# Patient Record
Sex: Male | Born: 1975
Health system: Southern US, Community
[De-identification: ages and names within clinical notes are randomized; demographics above are authoritative.]

---

## 2001-12-03 ENCOUNTER — Emergency Department (HOSPITAL_COMMUNITY): Admission: EM | Admit: 2001-12-03 | Discharge: 2001-12-03 | Payer: Self-pay | Admitting: Emergency Medicine

## 2001-12-11 ENCOUNTER — Emergency Department (HOSPITAL_COMMUNITY): Admission: EM | Admit: 2001-12-11 | Discharge: 2001-12-11 | Payer: Self-pay | Admitting: *Deleted

## 2002-04-21 ENCOUNTER — Emergency Department (HOSPITAL_COMMUNITY): Admission: EM | Admit: 2002-04-21 | Discharge: 2002-04-21 | Payer: Self-pay | Admitting: Emergency Medicine

## 2010-04-05 ENCOUNTER — Emergency Department (HOSPITAL_BASED_OUTPATIENT_CLINIC_OR_DEPARTMENT_OTHER): Admission: EM | Admit: 2010-04-05 | Discharge: 2010-04-05 | Payer: Self-pay | Admitting: Emergency Medicine

## 2010-08-25 ENCOUNTER — Emergency Department (HOSPITAL_COMMUNITY): Admission: EM | Admit: 2010-08-25 | Discharge: 2010-08-25 | Payer: Self-pay | Admitting: Emergency Medicine

## 2011-02-14 LAB — URINALYSIS, ROUTINE W REFLEX MICROSCOPIC
Bilirubin Urine: NEGATIVE
Ketones, ur: NEGATIVE mg/dL
Nitrite: NEGATIVE
Protein, ur: NEGATIVE mg/dL
Specific Gravity, Urine: 1.022 (ref 1.005–1.030)
Urobilinogen, UA: 0.2 mg/dL (ref 0.0–1.0)

## 2011-02-14 LAB — GC/CHLAMYDIA PROBE AMP, GENITAL: Chlamydia, DNA Probe: POSITIVE — AB

## 2011-02-14 LAB — URINE MICROSCOPIC-ADD ON

## 2011-06-18 ENCOUNTER — Emergency Department (HOSPITAL_BASED_OUTPATIENT_CLINIC_OR_DEPARTMENT_OTHER)
Admission: EM | Admit: 2011-06-18 | Discharge: 2011-06-18 | Disposition: A | Discharge status: Home / Self Care | Payer: Self-pay | Attending: Emergency Medicine | Admitting: Emergency Medicine

## 2011-06-18 ENCOUNTER — Encounter: Payer: Self-pay | Admitting: Emergency Medicine

## 2011-06-18 DIAGNOSIS — K047 Periapical abscess without sinus: Secondary | ICD-10-CM | POA: Insufficient documentation

## 2011-06-18 MED ORDER — PENICILLIN V POTASSIUM 500 MG PO TABS: 500.0000 mg | ORAL_TABLET | Freq: Three times a day (TID) | ORAL | Status: AC

## 2011-06-18 MED ORDER — NAPROXEN 500 MG PO TABS: 500.0000 mg | ORAL_TABLET | Freq: Two times a day (BID) | ORAL | Status: AC

## 2011-06-18 MED ORDER — HYDROCODONE-ACETAMINOPHEN 5-325 MG PO TABS: 1.0000 | ORAL_TABLET | ORAL | Status: AC | PRN

## 2011-06-18 NOTE — ED Notes (Signed)
No PCP 

## 2011-06-18 NOTE — ED Notes (Signed)
Pt c/o dental pain (upper LT) x 2 days

## 2011-06-18 NOTE — ED Provider Notes (Signed)
History     Chief Complaint  Patient presents with  . Dental Pain   HPI Comments: Patient is aware that he had a broken left upper molar for a number of weeks to months. In the last 2 days he started having some pain in temperature sensitivity. He does not have a dentist of his own.  Patient is a 35 y.o. male presenting with tooth pain. The history is provided by the patient.  Dental PainThe primary symptoms include mouth pain. Primary symptoms do not include dental injury, oral bleeding, oral lesions, headaches, fever, shortness of breath, sore throat, angioedema or cough. The symptoms began 2 days ago. The symptoms are unchanged. The symptoms are new. The symptoms occur constantly.  Additional symptoms include: dental sensitivity to temperature and gum tenderness. Additional symptoms do not include: gum swelling, purulent gums, trismus, jaw pain, facial swelling, trouble swallowing, pain with swallowing, excessive salivation, dry mouth, taste disturbance, smell disturbance, drooling, ear pain, hearing loss, nosebleeds, swollen glands, goiter and fatigue.    History reviewed. No pertinent past medical history.  History reviewed. No pertinent past surgical history.  No family history on file.  History  Substance Use Topics  . Smoking status: Never Smoker   . Smokeless tobacco: Not on file  . Alcohol Use: Yes     occ      Review of Systems  Constitutional: Negative.  Negative for fever, chills and fatigue.  HENT: Positive for dental problem. Negative for hearing loss, ear pain, nosebleeds, sore throat, facial swelling, drooling and trouble swallowing.   Eyes: Negative.  Negative for discharge and redness.  Respiratory: Negative.  Negative for cough and shortness of breath.   Cardiovascular: Negative.  Negative for chest pain.  Gastrointestinal: Negative.  Negative for nausea, vomiting and abdominal pain.  Genitourinary: Negative.  Negative for hematuria.  Musculoskeletal:  Negative.  Negative for back pain.  Skin: Negative.  Negative for color change and rash.  Neurological: Negative for syncope and headaches.  Hematological: Negative.  Negative for adenopathy.  Psychiatric/Behavioral: Negative.  Negative for confusion.  All other systems reviewed and are negative.    Physical Exam  BP 120/75  Pulse 65  Temp(Src) 98.7 F (37.1 C) (Oral)  Resp 16  Ht 5\' 11"  (1.803 m)  Wt 215 lb (97.523 kg)  BMI 29.99 kg/m2  SpO2 100%  Physical Exam  Constitutional: He is oriented to person, place, and time. He appears well-developed and well-nourished.  HENT:  Head: Normocephalic and atraumatic.       Tooth #15 is broken. There is surrounding tenderness to palpation of the gums. There is no significant swelling noted. Face appears symmetric with no swelling and no lymphadenopathy in his anterior cervical nodes submandibular nodes. Tissues underneath the tongue are soft.  Eyes: Conjunctivae and EOM are normal. Pupils are equal, round, and reactive to light.  Neck: Normal range of motion. Neck supple.  Pulmonary/Chest: Effort normal.  Musculoskeletal: Normal range of motion.  Neurological: He is alert and oriented to person, place, and time.  Skin: Skin is warm and dry.  Psychiatric: He has a normal mood and affect. His behavior is normal. Judgment and thought content normal.    ED Course  Procedures  MDM Patient has likely early dental abscess. I will place him on penicillin and given pain medicine for home. He will be given a dental clinic list and he can followup with a dentist for tooth removal.      Nat Christen, MD 06/18/11 [id number]

## 2013-07-27 ENCOUNTER — Encounter (HOSPITAL_BASED_OUTPATIENT_CLINIC_OR_DEPARTMENT_OTHER): Payer: Self-pay

## 2013-07-27 ENCOUNTER — Emergency Department (HOSPITAL_BASED_OUTPATIENT_CLINIC_OR_DEPARTMENT_OTHER)
Admission: EM | Admit: 2013-07-27 | Discharge: 2013-07-27 | Disposition: A | Discharge status: Home / Self Care | Payer: Self-pay | Attending: Emergency Medicine | Admitting: Emergency Medicine

## 2013-07-27 DIAGNOSIS — L089 Local infection of the skin and subcutaneous tissue, unspecified: Secondary | ICD-10-CM | POA: Insufficient documentation

## 2013-07-27 DIAGNOSIS — R21 Rash and other nonspecific skin eruption: Secondary | ICD-10-CM | POA: Insufficient documentation

## 2013-07-27 DIAGNOSIS — Y929 Unspecified place or not applicable: Secondary | ICD-10-CM | POA: Insufficient documentation

## 2013-07-27 DIAGNOSIS — L539 Erythematous condition, unspecified: Secondary | ICD-10-CM | POA: Insufficient documentation

## 2013-07-27 DIAGNOSIS — Y939 Activity, unspecified: Secondary | ICD-10-CM | POA: Insufficient documentation

## 2013-07-27 DIAGNOSIS — L03119 Cellulitis of unspecified part of limb: Secondary | ICD-10-CM

## 2013-07-27 MED ORDER — HYDROCODONE-ACETAMINOPHEN 5-325 MG PO TABS
2.0000 | ORAL_TABLET | Freq: Once | ORAL | Status: AC
  Administered 2013-07-27: 2 via ORAL
  Filled 2013-07-27: qty 2

## 2013-07-27 MED ORDER — HYDROCODONE-ACETAMINOPHEN 5-325 MG PO TABS: 2.0000 | ORAL_TABLET | ORAL | Status: DC | PRN

## 2013-07-27 MED ORDER — SULFAMETHOXAZOLE-TRIMETHOPRIM 800-160 MG PO TABS: 1.0000 | ORAL_TABLET | Freq: Two times a day (BID) | ORAL | Status: AC

## 2013-07-27 NOTE — ED Notes (Signed)
Patient reports insect bite 2 days ago on lower left leg, raised area with pus noted, denies drainage. Soreness to site

## 2013-07-27 NOTE — ED Provider Notes (Signed)
CSN: [id number]     Arrival date & time 07/27/13  1946 History   First MD Initiated Contact with Patient 07/27/13 2112     Chief Complaint  Patient presents with  . Insect Bite   (Consider location/radiation/quality/duration/timing/severity/associated sxs/prior Treatment) Patient is a 37 y.o. male presenting with rash. The history is provided by the patient. No language interpreter was used.  Rash Location:  Leg Leg rash location:  L leg Quality: blistering and redness   Severity:  Moderate Onset quality:  Gradual Duration:  2 days Timing:  Constant Progression:  Worsening Chronicity:  New Relieved by:  Nothing Ineffective treatments:  None tried Pt reports he was bitten by something 2 days ago.  Pt reports areas are red and swollen now.  Pustule   History reviewed. No pertinent past medical history. History reviewed. No pertinent past surgical history. No family history on file. History  Substance Use Topics  . Smoking status: Never Smoker   . Smokeless tobacco: Not on file  . Alcohol Use: Yes     Comment: occ    Review of Systems  Skin: Positive for rash and wound.  All other systems reviewed and are negative.    Allergies  Review of patient's allergies indicates no known allergies.  Home Medications  No current outpatient prescriptions on file. BP 123/77  Pulse 92  Temp(Src) 99.3 F (37.4 C) (Oral)  Resp 18  SpO2 99% Physical Exam  Nursing note and vitals reviewed. Constitutional: He is oriented to person, place, and time. He appears well-developed and well-nourished.  HENT:  Head: Normocephalic.  Musculoskeletal: He exhibits tenderness.  Pustule left ankle, erythema and swelling  Neurological: He is alert and oriented to person, place, and time. He has normal reflexes.  Skin: There is erythema.  Psychiatric: He has a normal mood and affect.    ED Course  Procedures (including critical care time) Labs Review Labs Reviewed  WOUND CULTURE    Imaging Review No results found.  MDM   1. Bacterial skin infection of leg    I open pustule with 18 gauge to obtain a culture   Elson Areas, PA-C 07/27/13 2130

## 2013-07-27 NOTE — ED Provider Notes (Signed)
Medical screening examination/treatment/procedure(s) were performed by non-physician practitioner and as supervising physician I was immediately available for consultation/collaboration.  Kristen N Ward, DO 07/27/13 2315 

## 2013-07-30 ENCOUNTER — Telehealth (HOSPITAL_COMMUNITY): Payer: Self-pay | Admitting: Emergency Medicine

## 2013-07-30 LAB — WOUND CULTURE: Gram Stain: NONE SEEN

## 2015-05-26 ENCOUNTER — Emergency Department (HOSPITAL_COMMUNITY)
Admission: EM | Admit: 2015-05-26 | Discharge: 2015-05-27 | Disposition: A | Discharge status: Home / Self Care | Payer: Self-pay | Attending: Emergency Medicine | Admitting: Emergency Medicine

## 2015-05-26 ENCOUNTER — Encounter (HOSPITAL_COMMUNITY): Payer: Self-pay

## 2015-05-26 ENCOUNTER — Emergency Department (HOSPITAL_COMMUNITY): Payer: Self-pay

## 2015-05-26 DIAGNOSIS — Y998 Other external cause status: Secondary | ICD-10-CM | POA: Insufficient documentation

## 2015-05-26 DIAGNOSIS — S20212A Contusion of left front wall of thorax, initial encounter: Secondary | ICD-10-CM | POA: Insufficient documentation

## 2015-05-26 DIAGNOSIS — Z791 Long term (current) use of non-steroidal anti-inflammatories (NSAID): Secondary | ICD-10-CM | POA: Insufficient documentation

## 2015-05-26 DIAGNOSIS — S299XXA Unspecified injury of thorax, initial encounter: Secondary | ICD-10-CM

## 2015-05-26 DIAGNOSIS — Y9389 Activity, other specified: Secondary | ICD-10-CM | POA: Insufficient documentation

## 2015-05-26 DIAGNOSIS — Y9289 Other specified places as the place of occurrence of the external cause: Secondary | ICD-10-CM | POA: Insufficient documentation

## 2015-05-26 DIAGNOSIS — W01198A Fall on same level from slipping, tripping and stumbling with subsequent striking against other object, initial encounter: Secondary | ICD-10-CM | POA: Insufficient documentation

## 2015-05-26 MED ORDER — HYDROCODONE-ACETAMINOPHEN 5-325 MG PO TABS: 1.0000 | ORAL_TABLET | ORAL | Status: DC | PRN

## 2015-05-26 MED ORDER — IBUPROFEN 800 MG PO TABS
800.0000 mg | ORAL_TABLET | Freq: Once | ORAL | Status: AC
  Administered 2015-05-27: 800 mg via ORAL
  Filled 2015-05-26: qty 1

## 2015-05-26 MED ORDER — NAPROXEN 500 MG PO TABS: 500.0000 mg | ORAL_TABLET | Freq: Two times a day (BID) | ORAL | Status: DC

## 2015-05-26 NOTE — ED Provider Notes (Signed)
CSN: [id number]     Arrival date & time 05/26/15  2311 History   First MD Initiated Contact with Patient 05/26/15 2317     Chief Complaint  Patient presents with  . Chest Injury     (Consider location/radiation/quality/duration/timing/severity/associated sxs/prior Treatment) The history is provided by the patient.   Kyle Park is a 39 y.o. male who presents to the ED with chest wall pain. He states that he fell over a stick and the stick hit him in the chest. The pain increases with movement. He denies shortness of breath, palpations, n/v or other problems.   History reviewed. No pertinent past medical history. History reviewed. No pertinent past surgical history. No family history on file. History  Substance Use Topics  . Smoking status: Never Smoker   . Smokeless tobacco: Not on file  . Alcohol Use: Yes     Comment: occ    Review of Systems  Cardiovascular:       Left chest wall pain  all other systems negative    Allergies  Review of patient's allergies indicates no known allergies.  Home Medications   Prior to Admission medications   Medication Sig Start Date End Date Taking? Authorizing Provider  HYDROcodone-acetaminophen (NORCO/VICODIN) 5-325 MG per tablet Take 1 tablet by mouth every 4 (four) hours as needed for severe pain. 05/26/15   Jlynn Langille Orlene Och, NP  naproxen (NAPROSYN) 500 MG tablet Take 1 tablet (500 mg total) by mouth 2 (two) times daily. 05/26/15   Ithan Touhey Orlene Och, NP   BP 115/78 mmHg  Pulse 68  Temp(Src) 98.8 F (37.1 C) (Oral)  Resp 16  Ht  (1.803 m)  Wt 190 lb (86.183 kg)  BMI 26.51 kg/m2  SpO2 100% Physical Exam  Constitutional: He is oriented to person, place, and time. He appears well-developed and well-nourished. No distress.  Eyes: Conjunctivae and EOM are normal.  Neck: Normal range of motion. Neck supple.  Cardiovascular: Normal rate and regular rhythm.   Pulmonary/Chest: Effort normal and breath sounds normal. He has no wheezes. He  has no rales. He exhibits tenderness. He exhibits no crepitus.    Abdominal: Soft. Bowel sounds are normal. There is no tenderness.  Musculoskeletal: Normal range of motion.  Neurological: He is alert and oriented to person, place, and time. No cranial nerve deficit.  Skin: Skin is warm and dry.  Psychiatric: He has a normal mood and affect. His behavior is normal.  Nursing note and vitals reviewed.   ED Course  Procedures (including critical care time) Labs Review Labs Reviewed - No data to display  Imaging Review Dg Chest 2 View  05/26/2015   CLINICAL DATA:  Fall 2 days ago with left-sided chest pain, initial encounter  EXAM: CHEST - 2 VIEW  COMPARISON:  None.  FINDINGS: The heart size and mediastinal contours are within normal limits. Both lungs are clear. The visualized skeletal structures are unremarkable.  IMPRESSION: No active disease.   Electronically Signed   By: Alcide Clever M.D.   On: 05/26/2015 23:51     EKG Interpretation None      MDM  39 y.o. male with left chest wall pain after falling on a stick 2 days ago. Will treat for pain and inflammation. He will return if symptoms worsen. Discussed with the patient and all questioned fully answered.   Final diagnoses:  Chest wall injury, initial encounter  Chest wall contusion, left, initial encounter       [facility] Orlene Och, NP  05/27/15 0004  Shon Batonourtney F Horton, MD 05/27/15 [phone]

## 2015-05-26 NOTE — ED Notes (Signed)
Pt states he fell 2 days ago, tripped and fell over a stick that hit him in the chest, states pain in that area is worse with movement.

## 2015-05-26 NOTE — Discharge Instructions (Signed)

## 2015-06-10 MED FILL — Hydrocodone-Acetaminophen Tab 5-325 MG: ORAL | Qty: 6 | Status: AC

## 2016-08-17 ENCOUNTER — Emergency Department (HOSPITAL_COMMUNITY)
Admission: EM | Admit: 2016-08-17 | Discharge: 2016-08-17 | Disposition: A | Discharge status: Home / Self Care | Payer: Self-pay | Attending: Dermatology | Admitting: Dermatology

## 2016-08-17 ENCOUNTER — Encounter (HOSPITAL_COMMUNITY): Payer: Self-pay | Admitting: Cardiology

## 2016-08-17 DIAGNOSIS — Z5321 Procedure and treatment not carried out due to patient leaving prior to being seen by health care provider: Secondary | ICD-10-CM | POA: Insufficient documentation

## 2016-08-17 DIAGNOSIS — R1110 Vomiting, unspecified: Secondary | ICD-10-CM | POA: Insufficient documentation

## 2016-08-17 NOTE — ED Triage Notes (Signed)
Vomiting 2-3 days.  [facility].

## 2016-08-17 NOTE — ED Notes (Signed)
Pt not in treatment area or waiting room.  Unable to determine the time patient left the department.  Chart removed from track board at this time

## 2016-09-02 ENCOUNTER — Encounter (HOSPITAL_COMMUNITY): Payer: Self-pay | Admitting: Emergency Medicine

## 2016-09-02 ENCOUNTER — Emergency Department (HOSPITAL_COMMUNITY): Payer: Self-pay

## 2016-09-02 ENCOUNTER — Emergency Department (HOSPITAL_COMMUNITY)
Admission: EM | Admit: 2016-09-02 | Discharge: 2016-09-02 | Disposition: A | Discharge status: Home / Self Care | Payer: Self-pay | Attending: Emergency Medicine | Admitting: Emergency Medicine

## 2016-09-02 DIAGNOSIS — Z79899 Other long term (current) drug therapy: Secondary | ICD-10-CM | POA: Insufficient documentation

## 2016-09-02 DIAGNOSIS — K529 Noninfective gastroenteritis and colitis, unspecified: Secondary | ICD-10-CM | POA: Insufficient documentation

## 2016-09-02 DIAGNOSIS — R112 Nausea with vomiting, unspecified: Secondary | ICD-10-CM

## 2016-09-02 DIAGNOSIS — R197 Diarrhea, unspecified: Secondary | ICD-10-CM

## 2016-09-02 LAB — URINALYSIS, ROUTINE W REFLEX MICROSCOPIC
Bilirubin Urine: NEGATIVE
Glucose, UA: NEGATIVE mg/dL
LEUKOCYTES UA: NEGATIVE
NITRITE: NEGATIVE
PH: 5.5 (ref 5.0–8.0)
PROTEIN: NEGATIVE mg/dL
Specific Gravity, Urine: >1.03 — ABNORMAL HIGH (ref 1.005–1.030)

## 2016-09-02 LAB — COMPREHENSIVE METABOLIC PANEL
ALT: 13 U/L — ABNORMAL LOW (ref 17–63)
AST: 22 U/L (ref 15–41)
Albumin: 4.5 g/dL (ref 3.5–5.0)
Alkaline Phosphatase: 61 U/L (ref 38–126)
Anion gap: 10 (ref 5–15)
BILIRUBIN TOTAL: 0.7 mg/dL (ref 0.3–1.2)
BUN: 14 mg/dL (ref 6–20)
CALCIUM: 9.1 mg/dL (ref 8.9–10.3)
CO2: 26 mmol/L (ref 22–32)
Chloride: 101 mmol/L (ref 101–111)
Creatinine, Ser: 1.26 mg/dL — ABNORMAL HIGH (ref 0.61–1.24)
GFR calc Af Amer: >60 mL/min (ref >60)
Glucose, Bld: 133 mg/dL — ABNORMAL HIGH (ref 65–99)
POTASSIUM: 4.3 mmol/L (ref 3.5–5.1)
Sodium: 137 mmol/L (ref 135–145)
TOTAL PROTEIN: 7.7 g/dL (ref 6.5–8.1)

## 2016-09-02 LAB — CBC
HEMATOCRIT: 40.5 % (ref 39.0–52.0)
Hemoglobin: 13.6 g/dL (ref 13.0–17.0)
MCH: 27.1 pg (ref 26.0–34.0)
MCHC: 33.6 g/dL (ref 30.0–36.0)
MCV: 80.8 fL (ref 78.0–100.0)
PLATELETS: 275 10*3/uL (ref 150–400)
RBC: 5.01 MIL/uL (ref 4.22–5.81)
RDW: 14.3 % (ref 11.5–15.5)
WBC: 9.6 10*3/uL (ref 4.0–10.5)

## 2016-09-02 LAB — URINE MICROSCOPIC-ADD ON

## 2016-09-02 LAB — LIPASE, BLOOD: Lipase: 17 U/L (ref 11–51)

## 2016-09-02 MED ORDER — ONDANSETRON HCL 4 MG PO TABS: 4.0000 mg | ORAL_TABLET | Freq: Four times a day (QID) | ORAL | 0 refills | Status: DC

## 2016-09-02 MED ORDER — IOPAMIDOL (ISOVUE-300) INJECTION 61%
100.0000 mL | Freq: Once | INTRAVENOUS | Status: AC | PRN
  Administered 2016-09-02: 100 mL via INTRAVENOUS

## 2016-09-02 MED ORDER — PROMETHAZINE HCL 25 MG/ML IJ SOLN
12.5000 mg | Freq: Once | INTRAMUSCULAR | Status: AC
  Administered 2016-09-02: 12.5 mg via INTRAVENOUS
  Filled 2016-09-02: qty 1

## 2016-09-02 MED ORDER — FAMOTIDINE 20 MG PO TABS: 20.0000 mg | ORAL_TABLET | Freq: Two times a day (BID) | ORAL | 0 refills | Status: DC

## 2016-09-02 MED ORDER — FAMOTIDINE IN NACL 20-0.9 MG/50ML-% IV SOLN
20.0000 mg | Freq: Once | INTRAVENOUS | Status: AC
  Administered 2016-09-02: 20 mg via INTRAVENOUS
  Filled 2016-09-02: qty 50

## 2016-09-02 MED ORDER — ONDANSETRON HCL 4 MG/2ML IJ SOLN
INTRAMUSCULAR | Status: AC
  Administered 2016-09-02: 4 mg via INTRAVENOUS
  Filled 2016-09-02: qty 2

## 2016-09-02 MED ORDER — ONDANSETRON HCL 4 MG/2ML IJ SOLN
4.0000 mg | Freq: Once | INTRAMUSCULAR | Status: AC | PRN
  Administered 2016-09-02: 4 mg via INTRAVENOUS

## 2016-09-02 MED ORDER — SODIUM CHLORIDE 0.9 % IV BOLUS (SEPSIS)
1000.0000 mL | Freq: Once | INTRAVENOUS | Status: AC
  Administered 2016-09-02: 1000 mL via INTRAVENOUS

## 2016-09-02 MED ORDER — IOPAMIDOL (ISOVUE-300) INJECTION 61%
INTRAVENOUS | Status: AC
  Filled 2016-09-02: qty 30

## 2016-09-02 NOTE — ED Triage Notes (Signed)
Patient c/o nausea and vomiting that started yesterday. Per patient small amount of diarrhea. Patient reports some blood in vomiting and "feeling of heartburn." Patient reports taking OTC medication for "indigestion" but vomited back up. Per patient when asked about abd pain patient states "feels funny not like pain, like I need to eat." Denies any fevers.

## 2016-09-02 NOTE — Discharge Instructions (Signed)
Small amts of clear fluids today then bland diet as tolerated starting tomorrow.  Follow-up with your doctor or return to ER for any worsening symptoms

## 2016-09-02 NOTE — ED Notes (Signed)
Pt requesting Ginger-ale to drink. 

## 2016-09-02 NOTE — ED Provider Notes (Signed)
[facility]-EMERGENCY DEPT Provider Note   CSN: [id number] Arrival date & time: 09/02/16  1006     History   Chief Complaint Chief Complaint  Patient presents with  . Emesis    HPI Kyle Park is a 40 y.o. male.  HPI   Kyle Park is a 40 y.o. male who presents to the Emergency Department complaining of epigastric pain, nausea and vomiting Since 6 AM this morning. He reports feeling "heartburn" and took some over-the-counter antacids without relief. He reports multiple episodes of vomiting and unable to keep down any fluids. He describes his upper abdominal pain as crampy and intermittent. He noticed some streaky, right red blood in his vomitus this morning. An one episode of loose stools. He denies fever, chills, chest pain or shortness of breath. Also denies recent drugs or alcohol. States he has been having "heartburn issues" for approximately one month.  History reviewed. No pertinent past medical history.  There are no active problems to display for this patient.   History reviewed. No pertinent surgical history.     Home Medications    Prior to Admission medications   Medication Sig Start Date End Date Taking? Authorizing Provider  HYDROcodone-acetaminophen (NORCO/VICODIN) 5-325 MG per tablet Take 1 tablet by mouth every 4 (four) hours as needed for severe pain. 05/26/15   Hope Orlene Och, NP  naproxen (NAPROSYN) 500 MG tablet Take 1 tablet (500 mg total) by mouth 2 (two) times daily. 05/26/15   Hope Orlene Och, NP    Family History History reviewed. No pertinent family history.  Social History Social History  Substance Use Topics  . Smoking status: Never Smoker  . Smokeless tobacco: Never Used  . Alcohol use Yes     Comment: occ     Allergies   Review of patient's allergies indicates no known allergies.   Review of Systems Review of Systems  Constitutional: Negative for appetite change, chills and fever.  Respiratory: Negative for shortness of breath.     Cardiovascular: Negative for chest pain.  Gastrointestinal: Positive for abdominal pain, diarrhea, nausea and vomiting. Negative for blood in stool.  Genitourinary: Negative for decreased urine volume, difficulty urinating and dysuria.  Musculoskeletal: Negative for back pain.  Skin: Negative for color change and rash.  Neurological: Negative for dizziness, weakness and numbness.  Hematological: Negative for adenopathy.  All other systems reviewed and are negative.    Physical Exam Updated Vital Signs BP 132/84 (BP Location: Left Arm)   Pulse 90   Temp 97.5 F (36.4 C) (Oral)   Resp 16   Ht 5\' 10"  (1.778 m)   Wt 79.4 kg   SpO2 100%   BMI 25.11 kg/m   Physical Exam  Constitutional: He is oriented to person, place, and time. He appears well-developed and well-nourished. No distress.  HENT:  Head: Normocephalic and atraumatic.  Mouth/Throat: Oropharynx is clear and moist.  Cardiovascular: Normal rate, regular rhythm and intact distal pulses.   No murmur heard. Pulmonary/Chest: Effort normal and breath sounds normal. No respiratory distress.  Abdominal: Soft. Bowel sounds are normal. He exhibits no distension and no mass. There is no hepatosplenomegaly. There is tenderness. There is no rebound, no guarding and no tenderness at McBurney's point.  Mild tenderness to palpation of epigastric area. No guarding no rebound.  Musculoskeletal: Normal range of motion. He exhibits no edema.  Neurological: He is alert and oriented to person, place, and time. He exhibits normal muscle tone. Coordination normal.  Skin: Skin is warm and dry.  Psychiatric: He has a normal mood and affect.  Nursing note and vitals reviewed.    ED Treatments / Results  Labs (all labs ordered are listed, but only abnormal results are displayed) Labs Reviewed  COMPREHENSIVE METABOLIC PANEL - Abnormal; Notable for the following:       Result Value   Glucose, Bld 133 (*)    Creatinine, Ser 1.26 (*)    ALT  13 (*)    All other components within normal limits  URINALYSIS, ROUTINE W REFLEX MICROSCOPIC (NOT [facility]) - Abnormal; Notable for the following:    Specific Gravity, Urine >1.030 (*)    Hgb urine dipstick MODERATE (*)    Ketones, ur TRACE (*)    All other components within normal limits  URINE MICROSCOPIC-ADD ON - Abnormal; Notable for the following:    Squamous Epithelial / LPF 0-5 (*)    Bacteria, UA MANY (*)    Crystals CA OXALATE CRYSTALS (*)    All other components within normal limits  LIPASE, BLOOD  CBC    EKG  EKG Interpretation None       Radiology Ct Abdomen Pelvis W Contrast  Result Date: 09/02/2016 CLINICAL DATA:  Epigastric pain and vomiting. EXAM: CT ABDOMEN AND PELVIS WITH CONTRAST TECHNIQUE: Multidetector CT imaging of the abdomen and pelvis was performed using the standard protocol following bolus administration of intravenous contrast. CONTRAST:  100mL ISOVUE-300 IOPAMIDOL (ISOVUE-300) INJECTION 61% COMPARISON:  None. FINDINGS: Lower chest: No acute abnormality. Hepatobiliary: 5 mm too small to be accurately characterized hypoattenuated lesion in the dome of the liver. Otherwise liver parenchyma demonstrates normal attenuation. Gallbladder is contracted with mild hyper attenuation of the wall, which may be due to its contracted state. Pancreas: Unremarkable. No pancreatic ductal dilatation or surrounding inflammatory changes. Spleen: Normal in size without focal abnormality. Adrenals/Urinary Tract: Adrenal glands are unremarkable. Kidneys are normal, without renal calculi, focal lesion, or hydronephrosis. Bladder is unremarkable. Stomach/Bowel: Stomach is within normal limits. Appendix appears normal. No evidence of bowel wall thickening, distention, or inflammatory changes. Vascular/Lymphatic: No significant vascular findings are present. No enlarged abdominal or pelvic lymph nodes. Reproductive: Prostate is unremarkable. Other: No abdominal wall hernia or abnormality.  No abdominopelvic ascites. Musculoskeletal: No acute or significant osseous findings. IMPRESSION: No evidence of acute abnormality within the abdomen or pelvis. Mild hypoattenuation of the gallbladder wall may be due to its contracted state. Electronically Signed   By: Ted Mcalpineobrinka  Dimitrova M.D.   On: 09/02/2016 14:33    Procedures Procedures (including critical care time)  Medications Ordered in ED Medications  sodium chloride 0.9 % bolus 1,000 mL (not administered)  famotidine (PEPCID) IVPB 20 mg premix (not administered)  ondansetron (ZOFRAN) injection 4 mg (4 mg Intravenous Given 09/02/16 1033)     Initial Impression / Assessment and Plan / ED Course  I have reviewed the triage vital signs and the nursing notes.  Pertinent labs & imaging results that were available during my care of the patient were reviewed by me and considered in my medical decision making (see chart for details).  Clinical Course    Patient is well-appearing. Vital signs are stable. No concerning symptoms for surgical abdomen.  CT reassuring. Sx's likely viral.   Pt is feeling better and has tolerated oral fluid trial.  Appears stable for d/c.  Will Rx zofran and pecid.  Pt agrees to ER return if not improving  Final Clinical Impressions(s) / ED Diagnoses   Final diagnoses:  Nausea vomiting and diarrhea  Gastroenteritis  New Prescriptions Discharge Medication List as of 09/02/2016  2:52 PM    START taking these medications   Details  famotidine (PEPCID) 20 MG tablet Take 1 tablet (20 mg total) by mouth 2 (two) times daily., Starting Sat 09/02/2016, Print    ondansetron (ZOFRAN) 4 MG tablet Take 1 tablet (4 mg total) by mouth every 6 (six) hours., Starting Sat 09/02/2016, Print         Tashiana Lamarca [place], PA-C 09/05/16 1610    Bethann Berkshire, MD 09/06/16 2308

## 2016-09-02 NOTE — ED Notes (Signed)
Pt vomiting contrast. Notified T Triplett that pt not tolerating oral contrast

## 2017-04-01 IMAGING — CT CT ABD-PELV W/ CM
2 of 4 series · 16 of 46 positions shown, 18 images · IV contrast (iopamidol)
Comparison: None.

CLINICAL DATA: Epigastric pain and vomiting.

EXAM:
CT ABDOMEN AND PELVIS WITH CONTRAST
TECHNIQUE: Multidetector CT imaging of the abdomen and pelvis was performed
using the standard protocol following bolus administration of
intravenous contrast.
CONTRAST:  100mL 2SOGA7-2LL IOPAMIDOL (2SOGA7-2LL) INJECTION 61%

[Series 2: axial st · axial · 0.72mm/px · z∈[-494,-74]mm · 13 of 94 slices shown, 15 images]
[im 5/94  soft-tissue]
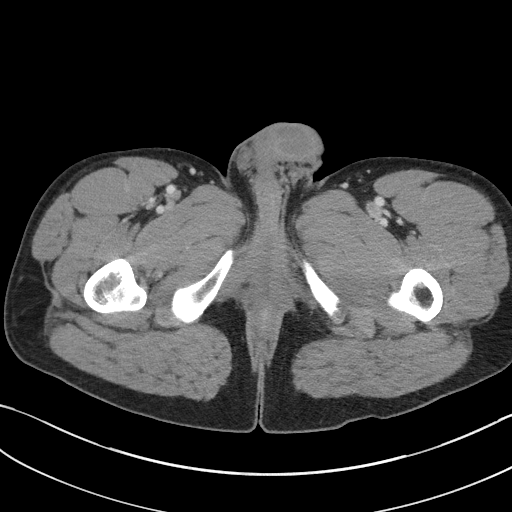
[im 5/94  bone]
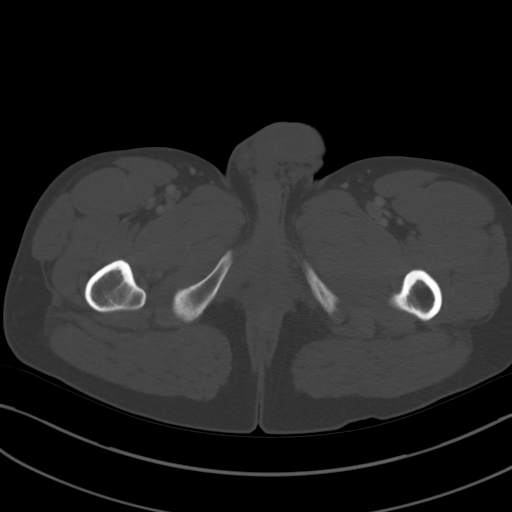
[im 13/94  soft-tissue]
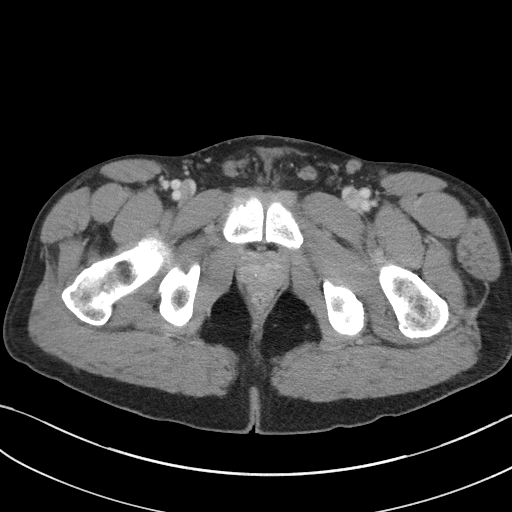
[im 21/94  soft-tissue]
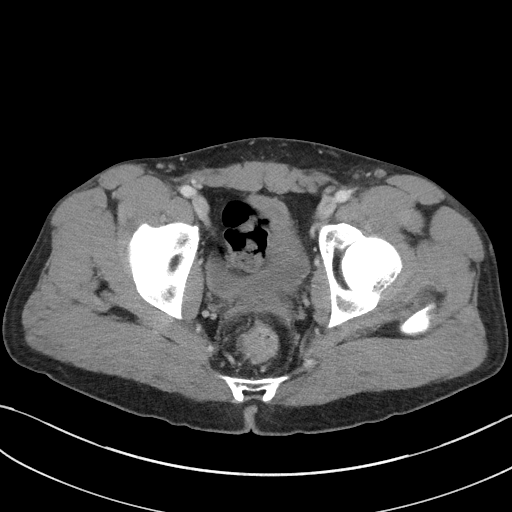
[im 25/94  soft-tissue]
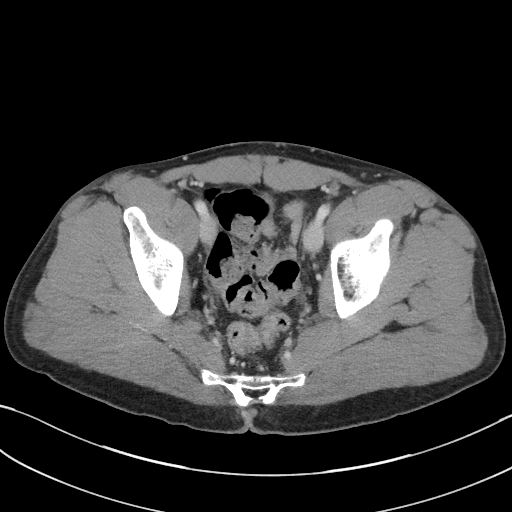
[im 33/94  soft-tissue]
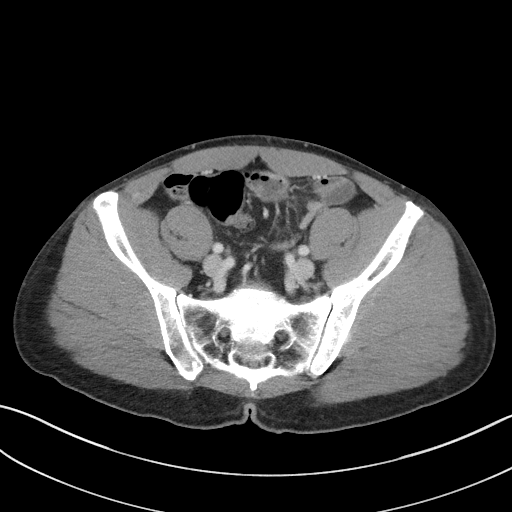
[im 41/94  soft-tissue]
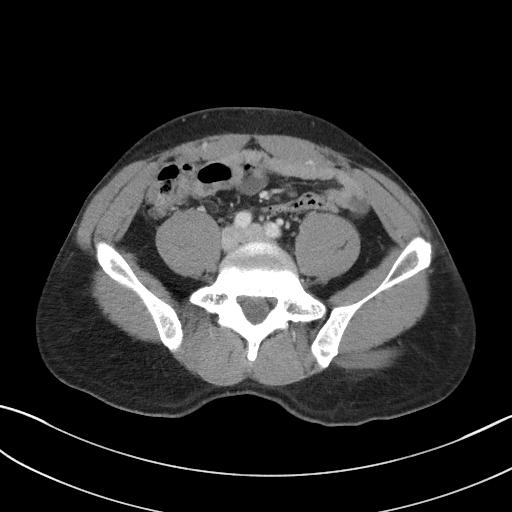
[im 49/94  soft-tissue]
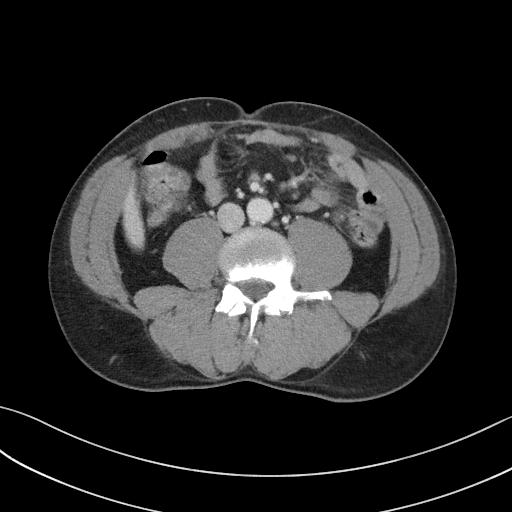
[im 53/94  soft-tissue]
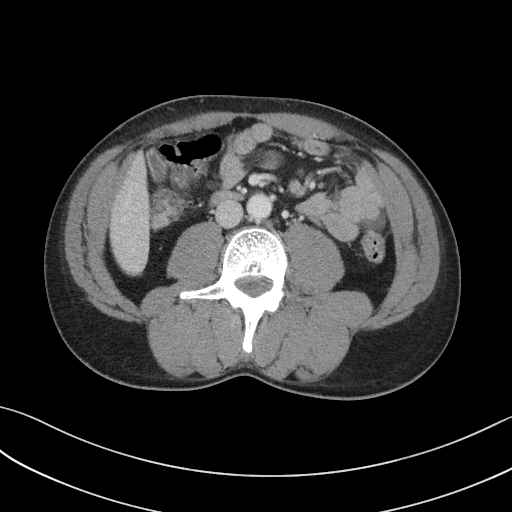
[im 61/94  soft-tissue]
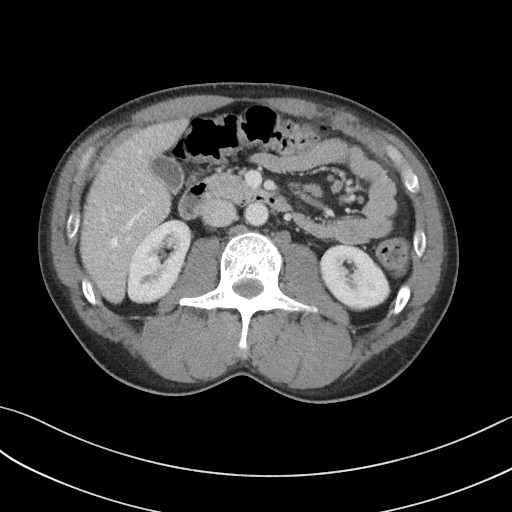
[im 61/94  bone]
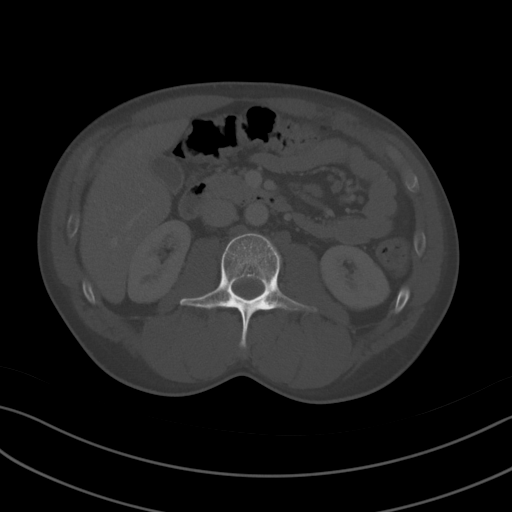
[im 69/94  soft-tissue]
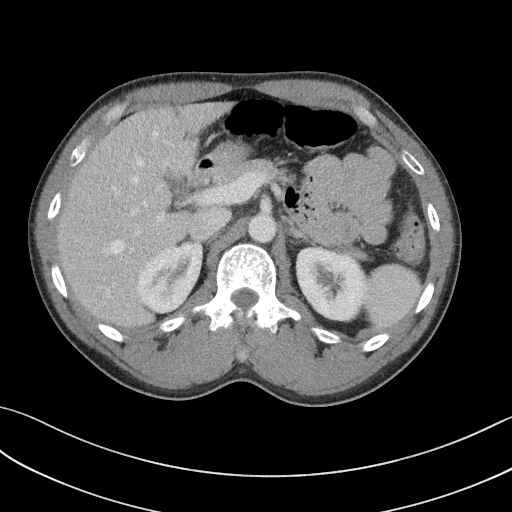
[im 73/94  soft-tissue]
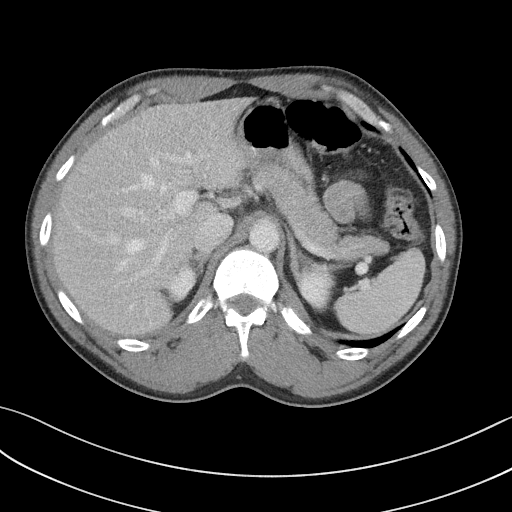
[im 81/94  soft-tissue]
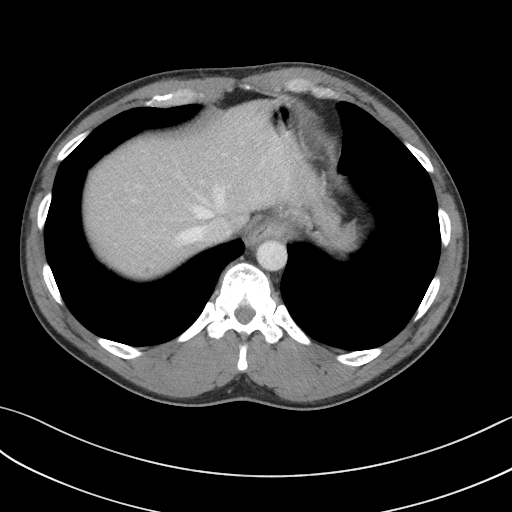
[im 89/94  soft-tissue]
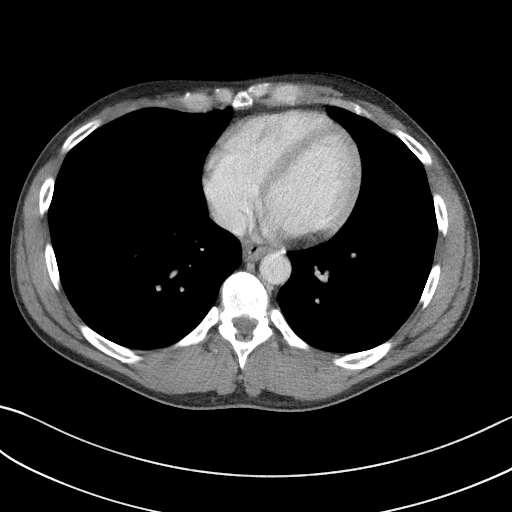

[Series 5: coronal st · coronal · 0.82mm/px · 3 of 91 slices shown]
[im 31/91  soft-tissue]
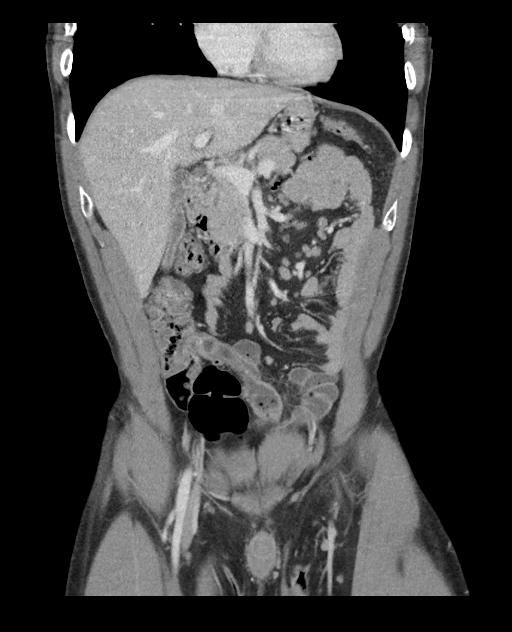
[im 41/91  soft-tissue]
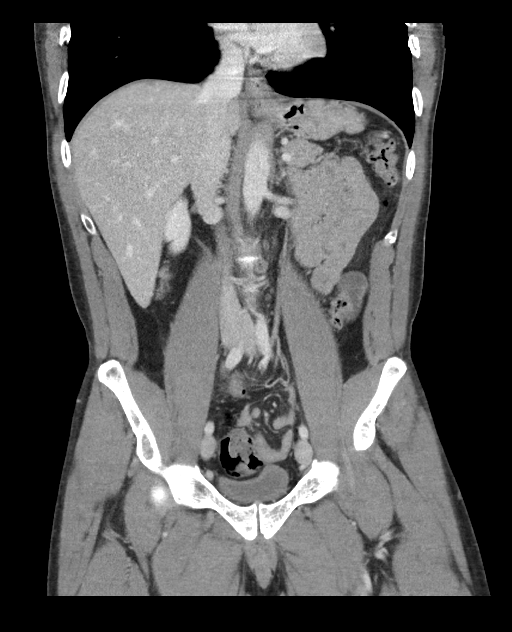
[im 51/91  soft-tissue]
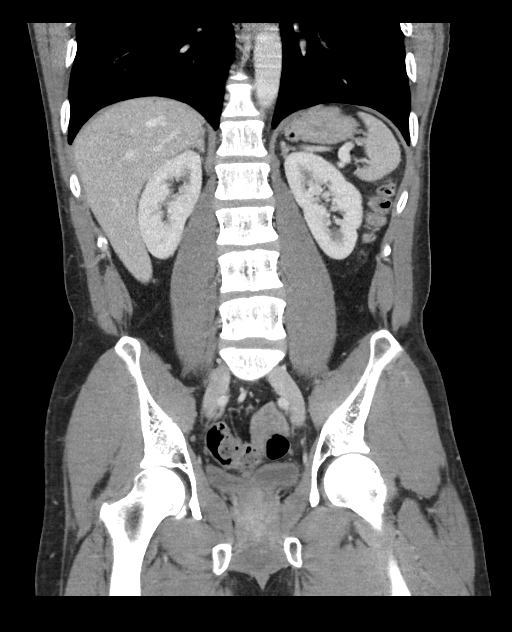

[16 of 46 positions shown; findings below may reference images not displayed]

FINDINGS: Lower chest: No acute abnormality.

Hepatobiliary: 5 mm too small to be accurately characterized
hypoattenuated lesion in the dome of the liver. Otherwise liver
parenchyma demonstrates normal attenuation. Gallbladder is
contracted with mild hyper attenuation of the wall, which may be due
to its contracted state.

Pancreas: Unremarkable. No pancreatic ductal dilatation or
surrounding inflammatory changes.

Spleen: Normal in size without focal abnormality.

Adrenals/Urinary Tract: Adrenal glands are unremarkable. Kidneys are
normal, without renal calculi, focal lesion, or hydronephrosis.
Bladder is unremarkable.

Stomach/Bowel: Stomach is within normal limits. Appendix appears
normal. No evidence of bowel wall thickening, distention, or
inflammatory changes.

Vascular/Lymphatic: No significant vascular findings are present. No
enlarged abdominal or pelvic lymph nodes.

Reproductive: Prostate is unremarkable.

Other: No abdominal wall hernia or abnormality. No abdominopelvic
ascites.

Musculoskeletal: No acute or significant osseous findings.
IMPRESSION: No evidence of acute abnormality within the abdomen or pelvis.

Mild hypoattenuation of the gallbladder wall may be due to its
contracted state.

## 2020-12-28 ENCOUNTER — Other Ambulatory Visit: Payer: Self-pay

## 2023-03-01 ENCOUNTER — Ambulatory Visit
Admission: EM | Admit: 2023-03-01 | Discharge: 2023-03-01 | Disposition: A | Discharge status: Home / Self Care | Payer: BC Managed Care – PPO | Attending: Family Medicine | Admitting: Family Medicine

## 2023-03-01 ENCOUNTER — Encounter: Payer: Self-pay | Admitting: Emergency Medicine

## 2023-03-01 ENCOUNTER — Ambulatory Visit: Payer: Self-pay

## 2023-03-01 DIAGNOSIS — J020 Streptococcal pharyngitis: Secondary | ICD-10-CM

## 2023-03-01 LAB — POC INFLUENZA A AND B ANTIGEN (URGENT CARE ONLY)
Influenza A Ag: NEGATIVE
Influenza B Ag: NEGATIVE

## 2023-03-01 LAB — POC SARS CORONAVIRUS 2 AG -  ED: SARS Coronavirus 2 Ag: NEGATIVE

## 2023-03-01 LAB — POCT RAPID STREP A (OFFICE): Rapid Strep A Screen: POSITIVE — AB

## 2023-03-01 MED ORDER — PENICILLIN V POTASSIUM 500 MG PO TABS: 500.0000 mg | ORAL_TABLET | Freq: Two times a day (BID) | ORAL | 0 refills | Status: AC

## 2023-03-01 MED ORDER — PREDNISOLONE SODIUM PHOSPHATE 15 MG/5ML PO SOLN
60.0000 mg | Freq: Once | ORAL | Status: AC
  Administered 2023-03-01: 60 mg via ORAL

## 2023-03-01 NOTE — Discharge Instructions (Signed)
Take antibiotic 2 times a day.  Take for 10 full days.  Do not stop antibiotic early Take Tylenol or ibuprofen for pain and fever May use sore throat gargle or lozenge for pain Call for problems

## 2023-03-01 NOTE — ED Triage Notes (Signed)
Patient c/o sore throat, headache, body aches and body chills x 3 days.  Patient has taken Ibuprofen.

## 2023-03-01 NOTE — ED Provider Notes (Signed)
[facility]    CSN: [id number] Arrival date & time: 03/01/23  1734      History   Chief Complaint Chief Complaint  Patient presents with   Sore Throat    HPI Kyle Park is a 47 y.o. male.   HPI  Has had a headache and body aches and sore throat for 3 days.  He has been taking ibuprofen.  No known exposure to illness.  History reviewed. No pertinent past medical history.  There are no problems to display for this patient.   History reviewed. No pertinent surgical history.     Home Medications    Prior to Admission medications   Medication Sig Start Date End Date Taking? Authorizing Provider  penicillin v potassium (VEETID) 500 MG tablet Take 1 tablet (500 mg total) by mouth 2 (two) times daily for 10 days. 03/01/23 03/11/23 Yes Raylene Everts, MD    Family History History reviewed. No pertinent family history.  Social History Social History   Tobacco Use   Smoking status: Never   Smokeless tobacco: Never  Vaping Use   Vaping Use: Never used  Substance Use Topics   Alcohol use: Yes    Comment: occ   Drug use: No     Allergies   Patient has no known allergies.   Review of Systems Review of Systems See HPI  Physical Exam Triage Vital Signs ED Triage Vitals  Enc Vitals Group     BP 03/01/23 1745 124/83     Pulse Rate 03/01/23 1745 88     Resp 03/01/23 1745 18     Temp 03/01/23 1745 98.7 F (37.1 C)     Temp Source 03/01/23 1745 Oral     SpO2 03/01/23 1745 97 %     Weight 03/01/23 1747 200 lb (90.7 kg)     Height 03/01/23 1747 5\' 11"  (1.803 m)     Head Circumference --      Peak Flow --      Pain Score 03/01/23 1746 5     Pain Loc --      Pain Edu? --      Excl. in [place]? --    No data found.  Updated Vital Signs BP 124/83 (BP Location: Right Arm)   Pulse 88   Temp 98.7 F (37.1 C) (Oral)   Resp 18   Ht 5\' 11"  (1.803 m)   Wt 90.7 kg   SpO2 97%   BMI 27.89 kg/m       Physical Exam Constitutional:       General: He is not in acute distress.    Appearance: He is well-developed.  HENT:     Head: Normocephalic and atraumatic.     Right Ear: Tympanic membrane normal.     Left Ear: Tympanic membrane normal.     Nose: No congestion.     Mouth/Throat:     Pharynx: Uvula midline. Pharyngeal swelling and posterior oropharyngeal erythema present.     Tonsils: No tonsillar exudate. 2+ on the right. 2+ on the left.  Eyes:     Conjunctiva/sclera: Conjunctivae normal.     Pupils: Pupils are equal, round, and reactive to light.  Cardiovascular:     Rate and Rhythm: Normal rate.  Pulmonary:     Effort: Pulmonary effort is normal. No respiratory distress.  Abdominal:     General: There is no distension.     Palpations: Abdomen is soft.  Musculoskeletal:  General: Normal range of motion.     Cervical back: Normal range of motion.  Lymphadenopathy:     Cervical: Cervical adenopathy present.  Skin:    General: Skin is warm and dry.  Neurological:     Mental Status: He is alert.      UC Treatments / Results  Labs (all labs ordered are listed, but only abnormal results are displayed) Labs Reviewed  POCT RAPID STREP A (OFFICE) - Abnormal; Notable for the following components:      Result Value   Rapid Strep A Screen Positive (*)    All other components within normal limits  POC SARS CORONAVIRUS 2 AG -  ED  POC INFLUENZA A AND B ANTIGEN (URGENT CARE ONLY)    EKG   Radiology No results found.  Procedures Procedures (including critical care time)  Medications Ordered in UC Medications  prednisoLONE (ORAPRED) 15 MG/5ML solution 60 mg (60 mg Oral Given 03/01/23 1817)    Initial Impression / Assessment and Plan / UC Course  I have reviewed the triage vital signs and the nursing notes.  Pertinent labs & imaging results that were available during my care of the patient were reviewed by me and considered in my medical decision making (see chart for details).     Patient states  he has severe throat pain and may have difficulty swallowing.  He is given a single dose of steroid to take to help improve the pain and swelling Final Clinical Impressions(s) / UC Diagnoses   Final diagnoses:  Strep throat     Discharge Instructions      Take antibiotic 2 times a day.  Take for 10 full days.  Do not stop antibiotic early Take Tylenol or ibuprofen for pain and fever May use sore throat gargle or lozenge for pain Call for problems     ED Prescriptions     Medication Sig Dispense Auth. Provider   penicillin v potassium (VEETID) 500 MG tablet Take 1 tablet (500 mg total) by mouth 2 (two) times daily for 10 days. 20 tablet Raylene Everts, MD      PDMP not reviewed this encounter.   Raylene Everts, MD 03/01/23 [phone]
